# Patient Record
Sex: Female | Born: 1975 | Race: White | Hispanic: No | Marital: Married | Smoking: Never smoker
Health system: Southern US, Community
[De-identification: ages and names within clinical notes are randomized; demographics above are authoritative.]

## PROBLEM LIST (undated history)

## (undated) DIAGNOSIS — Z789 Other specified health status: Secondary | ICD-10-CM

## (undated) HISTORY — PX: BREAST FIBROADENOMA SURGERY: SHX580

## (undated) HISTORY — DX: Other specified health status: Z78.9

---

## 2003-04-03 ENCOUNTER — Other Ambulatory Visit: Admission: RE | Admit: 2003-04-03 | Discharge: 2003-04-03 | Payer: Self-pay | Admitting: Obstetrics & Gynecology

## 2012-01-25 ENCOUNTER — Other Ambulatory Visit: Payer: Self-pay | Admitting: Oncology

## 2013-12-28 HISTORY — PX: OTHER SURGICAL HISTORY: SHX169

## 2016-11-09 ENCOUNTER — Telehealth: Payer: Self-pay | Admitting: Genetic Counselor

## 2016-11-09 NOTE — Telephone Encounter (Signed)
Ms. Zepeda family member was seen for genetic counseling in [place] and she spoke with the genetic counselor there, Mariah Gleason, to discuss an appointment for her.  Mariah referred her to [place] as Ms. Caughell lives in [place] would be a closer visit for her.  We discussed how genetic counseling works and that cost of session is $240, but typically insurance will cover some of those costs.  Ms. Eyestone would like to make an appointment, so I gave her our scheduler, Nikki's number--[phone], so that she can do that.

## 2019-11-06 ENCOUNTER — Other Ambulatory Visit: Payer: Self-pay

## 2019-11-06 DIAGNOSIS — Z20822 Contact with and (suspected) exposure to covid-19: Secondary | ICD-10-CM

## 2019-11-09 LAB — NOVEL CORONAVIRUS, NAA: SARS-CoV-2, NAA: NOT DETECTED

## 2020-01-18 ENCOUNTER — Other Ambulatory Visit: Payer: Self-pay

## 2020-01-19 ENCOUNTER — Ambulatory Visit: Payer: BC Managed Care – PPO | Admitting: Family Medicine

## 2020-01-19 ENCOUNTER — Other Ambulatory Visit: Payer: Self-pay

## 2020-01-19 ENCOUNTER — Encounter: Payer: Self-pay | Admitting: Family Medicine

## 2020-01-19 VITALS — BP 108/68 | HR 98 | Temp 97.8°F | Ht 64.5 in | Wt 135.5 lb

## 2020-01-19 DIAGNOSIS — Z0000 Encounter for general adult medical examination without abnormal findings: Secondary | ICD-10-CM | POA: Diagnosis not present

## 2020-01-19 DIAGNOSIS — Z23 Encounter for immunization: Secondary | ICD-10-CM | POA: Diagnosis not present

## 2020-01-19 DIAGNOSIS — B001 Herpesviral vesicular dermatitis: Secondary | ICD-10-CM

## 2020-01-19 DIAGNOSIS — L989 Disorder of the skin and subcutaneous tissue, unspecified: Secondary | ICD-10-CM | POA: Diagnosis not present

## 2020-01-19 LAB — LIPID PANEL
Cholesterol: 138 mg/dL (ref 0–200)
HDL: 47.5 mg/dL (ref >39.00)
LDL Cholesterol: 70 mg/dL (ref 0–99)
NonHDL: 90.75
Total CHOL/HDL Ratio: 3
Triglycerides: 103 mg/dL (ref 0.0–149.0)
VLDL: 20.6 mg/dL (ref 0.0–40.0)

## 2020-01-19 LAB — COMPREHENSIVE METABOLIC PANEL
ALT: 14 U/L (ref 0–35)
AST: 15 U/L (ref 0–37)
Albumin: 4.3 g/dL (ref 3.5–5.2)
Alkaline Phosphatase: 47 U/L (ref 39–117)
BUN: 11 mg/dL (ref 6–23)
CO2: 27 mEq/L (ref 19–32)
Calcium: 9.4 mg/dL (ref 8.4–10.5)
Chloride: 103 mEq/L (ref 96–112)
Creatinine, Ser: 0.62 mg/dL (ref 0.40–1.20)
GFR: 105.87 mL/min (ref >60.00)
Glucose, Bld: 91 mg/dL (ref 70–99)
Potassium: 4 mEq/L (ref 3.5–5.1)
Sodium: 140 mEq/L (ref 135–145)
Total Bilirubin: 0.5 mg/dL (ref 0.2–1.2)
Total Protein: 7.1 g/dL (ref 6.0–8.3)

## 2020-01-19 LAB — CBC
HCT: 40.9 % (ref 36.0–46.0)
Hemoglobin: 14 g/dL (ref 12.0–15.0)
MCHC: 34.1 g/dL (ref 30.0–36.0)
MCV: 91.1 fl (ref 78.0–100.0)
Platelets: 247 10*3/uL (ref 150.0–400.0)
RBC: 4.49 Mil/uL (ref 3.87–5.11)
RDW: 13.3 % (ref 11.5–15.5)
WBC: 7 10*3/uL (ref 4.0–10.5)

## 2020-01-19 MED ORDER — VALACYCLOVIR HCL 1 G PO TABS: ORAL_TABLET | ORAL | 0 refills | Status: DC

## 2020-01-19 NOTE — Patient Instructions (Addendum)
Give [place] 2-3 business days to get the results of your labs back.   Keep the diet clean and stay active.  If you do not hear anything about your referral in the next 1-2 weeks, call our office and ask for an update.  Use triple antibiotic ointment over the lower part of your nose.  Let us know if you need anything.

## 2020-01-19 NOTE — Progress Notes (Signed)
Chief Complaint  Patient presents with  . New Patient (Initial Visit)    needs a physical     Well Woman Angela Petersen is here for a complete physical.   Her last physical was >1 year ago.  Current diet: in general, a "healthy" diet. Current exercise: HIIT, running. Weight is stable and she denies daytime fatigue. No LMP recorded. Seatbelt? Yes  Health Maintenance Pap/HPV- Yes Mammogram- Yes Tetanus- No. HIV screening- Yes  Past Medical History:  Diagnosis Date  . No known health problems      Past Surgical History:  Procedure Laterality Date  . [place], 2004, 2017, 2018   bilateral  . CESAREAN SECTION    . OTHER SURGICAL HISTORY  2015   Removed tubes   Medications  Takes no meds routinely.    Allergies No Known Allergies  Review of Systems:  Constitutional:  no unexpected weight changes Eye:  no recent significant change in vision Ear/Nose/Mouth/Throat:  Ears:  no recent change in hearing Nose/Mouth/Throat:  no complaints of nasal congestion, no sore throat Cardiovascular: no chest pain Respiratory:  no shortness of breath Gastrointestinal:  no abdominal pain, no change in bowel habits GU:  Female: negative for dysuria or pelvic pain Musculoskeletal/Extremities:  no pain of the joints Integumentary (Skin/Breast): +recurrent scaling/bleeding area on nose; otherwise no abnormal skin lesions reported Neurologic:  no headaches Endocrine:  denies fatigue Hematologic/Lymphatic:  No areas of easy bleeding  Exam BP 108/68 (BP Location: Left Arm, Patient Position: Sitting, Cuff Size: Normal)   Pulse 98   Temp 97.8 F (36.6 C) (Temporal)   Ht 5' 4.5" (1.638 m)   Wt 135 lb 8 oz (61.5 kg)   SpO2 98%   BMI 22.90 kg/m  General:  well developed, well nourished, in no apparent distress Skin: lateral R nostril excoriation; there is a slightly raised and scaly lesion on L bridge of nose with some telangiectasis; otherwise no significant  moles, warts, or growths Head:  no masses, lesions, or tenderness Eyes:  pupils equal and round, sclera anicteric without injection Ears:  canals without lesions, TMs shiny without retraction, no obvious effusion, no erythema Nose:  nares patent, septum midline, mucosa normal, and no drainage or sinus tenderness Throat/Pharynx:  lips and gingiva without lesion; tongue and uvula midline; non-inflamed pharynx; no exudates or postnasal drainage Neck: neck supple without adenopathy, thyromegaly, or masses Lungs:  clear to auscultation, breath sounds equal bilaterally, no respiratory distress Cardio:  regular rate and rhythm, no LE edema Abdomen:  abdomen soft, nontender; bowel sounds normal; no masses or organomegaly Genital: Defer to GYN Musculoskeletal:  symmetrical muscle groups noted without atrophy or deformity Extremities:  no clubbing, cyanosis, or edema, no deformities, no skin discoloration Neuro:  gait normal; deep tendon reflexes normal and symmetric Psych: well oriented with normal range of affect and appropriate judgment/insight  Assessment and Plan  Well adult exam - Plan: Lipid panel, CBC, Comprehensive metabolic panel  Skin lesion - Plan: Ambulatory referral to Dermatology  Cold sore  Need for Tdap vaccination - Plan: Tdap vaccine greater than or equal to 7yo IM  Need for influenza vaccination - Plan: Flu Vaccine QUAD 6+ mos PF IM (Fluarix Quad PF)   [name]. Counseled on diet and exercise. Shared decision making performed regarding mammograms for breast cancer screening and limitations in younger females. TAO and valtrex for area on R nostril that recurs q winter.  Refer to derm for raised and scaly  lesion on nose, concern for SCC vs BCC.  Other orders as above. Follow up in 1 yr or prn. The patient voiced understanding and agreement to the plan.  [place], DO 01/19/20 2:46 PM

## 2020-10-22 ENCOUNTER — Other Ambulatory Visit: Payer: Self-pay | Admitting: Family Medicine

## 2020-10-22 DIAGNOSIS — C73 Malignant neoplasm of thyroid gland: Secondary | ICD-10-CM

## 2020-10-23 ENCOUNTER — Other Ambulatory Visit: Payer: Self-pay | Admitting: Family Medicine

## 2020-10-23 DIAGNOSIS — E042 Nontoxic multinodular goiter: Secondary | ICD-10-CM

## 2020-10-24 ENCOUNTER — Other Ambulatory Visit: Payer: Self-pay

## 2020-10-24 ENCOUNTER — Other Ambulatory Visit (INDEPENDENT_AMBULATORY_CARE_PROVIDER_SITE_OTHER): Payer: BC Managed Care – PPO

## 2020-10-24 DIAGNOSIS — C73 Malignant neoplasm of thyroid gland: Secondary | ICD-10-CM | POA: Diagnosis not present

## 2020-10-25 LAB — TSH: TSH: 1.37 mIU/L

## 2020-10-25 LAB — T4, FREE: Free T4: 1 ng/dL (ref 0.8–1.8)

## 2020-10-30 ENCOUNTER — Other Ambulatory Visit: Payer: Self-pay

## 2020-10-30 ENCOUNTER — Ambulatory Visit (HOSPITAL_BASED_OUTPATIENT_CLINIC_OR_DEPARTMENT_OTHER)
Admission: RE | Admit: 2020-10-30 | Discharge: 2020-10-30 | Disposition: A | Discharge status: Home / Self Care | Payer: BC Managed Care – PPO | Source: Ambulatory Visit | Attending: Family Medicine | Admitting: Family Medicine

## 2020-10-30 DIAGNOSIS — E042 Nontoxic multinodular goiter: Secondary | ICD-10-CM | POA: Diagnosis present

## 2021-01-29 ENCOUNTER — Ambulatory Visit (INDEPENDENT_AMBULATORY_CARE_PROVIDER_SITE_OTHER): Payer: BC Managed Care – PPO | Admitting: Family Medicine

## 2021-01-29 ENCOUNTER — Other Ambulatory Visit: Payer: Self-pay

## 2021-01-29 ENCOUNTER — Encounter: Payer: Self-pay | Admitting: Family Medicine

## 2021-01-29 VITALS — BP 102/62 | HR 69 | Temp 98.0°F | Ht 64.5 in | Wt 138.0 lb

## 2021-01-29 DIAGNOSIS — Z23 Encounter for immunization: Secondary | ICD-10-CM | POA: Diagnosis not present

## 2021-01-29 DIAGNOSIS — Z800 Family history of malignant neoplasm of digestive organs: Secondary | ICD-10-CM | POA: Diagnosis not present

## 2021-01-29 DIAGNOSIS — L570 Actinic keratosis: Secondary | ICD-10-CM | POA: Diagnosis not present

## 2021-01-29 DIAGNOSIS — Z1159 Encounter for screening for other viral diseases: Secondary | ICD-10-CM | POA: Diagnosis not present

## 2021-01-29 DIAGNOSIS — Z0001 Encounter for general adult medical examination with abnormal findings: Secondary | ICD-10-CM | POA: Diagnosis not present

## 2021-01-29 DIAGNOSIS — Z0000 Encounter for general adult medical examination without abnormal findings: Secondary | ICD-10-CM

## 2021-01-29 LAB — COMPREHENSIVE METABOLIC PANEL
ALT: 16 U/L (ref 0–35)
AST: 15 U/L (ref 0–37)
Albumin: 4.4 g/dL (ref 3.5–5.2)
Alkaline Phosphatase: 55 U/L (ref 39–117)
BUN: 12 mg/dL (ref 6–23)
CO2: 32 mEq/L (ref 19–32)
Calcium: 9.7 mg/dL (ref 8.4–10.5)
Chloride: 102 mEq/L (ref 96–112)
Creatinine, Ser: 0.64 mg/dL (ref 0.40–1.20)
GFR: 109 mL/min (ref >60.00)
Glucose, Bld: 104 mg/dL — ABNORMAL HIGH (ref 70–99)
Potassium: 3.7 mEq/L (ref 3.5–5.1)
Sodium: 139 mEq/L (ref 135–145)
Total Bilirubin: 0.5 mg/dL (ref 0.2–1.2)
Total Protein: 7.2 g/dL (ref 6.0–8.3)

## 2021-01-29 LAB — LIPID PANEL
Cholesterol: 161 mg/dL (ref 0–200)
HDL: 53 mg/dL (ref >39.00)
LDL Cholesterol: 90 mg/dL (ref 0–99)
NonHDL: 107.79
Total CHOL/HDL Ratio: 3
Triglycerides: 91 mg/dL (ref 0.0–149.0)
VLDL: 18.2 mg/dL (ref 0.0–40.0)

## 2021-01-29 NOTE — Progress Notes (Signed)
Chief Complaint  Patient presents with  . Annual Exam     Well Woman Angela Petersen is here for a complete physical.   Her last physical was >1 year ago.  Current diet: in general, a "healthy" diet. Current exercise: walking, wt lifting. Weight is stable and she denies fatigue out of ordinary. Seatbelt? Yes  Health Maintenance Pap/HPV- Yes Mammogram- Yes Tetanus- Yes Hep C screening- No HIV screening- Yes  Past Medical History:  Diagnosis Date  . No known health problems      Past Surgical History:  Procedure Laterality Date  . [place], 2004, 2017, 2018   bilateral  . CESAREAN SECTION    . OTHER SURGICAL HISTORY  2015   Removed tubes    Medications  Current Outpatient Medications on File Prior to Visit  Medication Sig Dispense Refill  . valACYclovir (VALTREX) 1000 MG tablet Take 2 tabs by mouth and repeat dose 12 hours later during outbreaks. 20 tablet 0   Allergies No Known Allergies  Review of Systems: Constitutional:  no unexpected weight changes Eye:  no recent significant change in vision Ear/Nose/Mouth/Throat:  Ears:  no recent change in hearing Nose/Mouth/Throat:  no complaints of nasal congestion, no sore throat Cardiovascular: no chest pain Respiratory:  no shortness of breath Gastrointestinal:  no abdominal pain, no change in bowel habits GU:  Female: negative for dysuria or pelvic pain Musculoskeletal/Extremities:  no pain of the joints Integumentary (Skin/Breast): red/scaly lesion on L shoulder Neurologic:  no headaches Endocrine:  denies fatigue Hematologic/Lymphatic:  No areas of easy bleeding  Exam BP 102/62 (BP Location: Right Arm, Patient Position: Sitting, Cuff Size: Normal)   Pulse 69   Temp 98 F (36.7 C) (Oral)   Ht 5' 4.5" (1.638 m)   Wt 138 lb (62.6 kg)   SpO2 99%   BMI 23.32 kg/m  General:  well developed, well nourished, in no apparent distress Skin: L anterior shoulder, there is a 0.7 x 0.3 cm  lesion that is red at the base and scaly- no fluctuance, drainage, ttp, induration. Otherwise no significant moles, warts, or growths Head:  no masses, lesions, or tenderness Eyes:  pupils equal and round, sclera anicteric without injection Ears:  canals without lesions, TMs shiny without retraction, no obvious effusion, no erythema Nose:  nares patent, septum midline, mucosa normal, and no drainage or sinus tenderness Throat/Pharynx:  lips and gingiva without lesion; tongue and uvula midline; non-inflamed pharynx; no exudates or postnasal drainage Neck: neck supple without adenopathy, thyromegaly, or masses Lungs:  clear to auscultation, breath sounds equal bilaterally, no respiratory distress Cardio:  regular rate and rhythm, no LE edema Abdomen:  abdomen soft, nontender; bowel sounds normal; no masses or organomegaly Genital: Defer to GYN Musculoskeletal:  symmetrical muscle groups noted without atrophy or deformity Extremities:  no clubbing, cyanosis, or edema, no deformities, no skin discoloration Neuro:  gait normal; deep tendon reflexes normal and symmetric Psych: well oriented with normal range of affect and appropriate judgment/insight  Procedure note: cryotherapy Verbal consent obtained 1 skin lesion treated Liquid nitrogen was applied via a thin spray creating an ice ball with 1-2 mm corona surrounding the lesion The patient tolerated the procedure well There were no immediate complications noted  Assessment and Plan  Well adult exam - Plan: Comprehensive metabolic panel, Lipid panel  Encounter for hepatitis C screening test for low risk patient - Plan: Hepatitis C antibody  Family history of colon cancer - Plan: Ambulatory referral to  Gastroenterology  Need for influenza vaccination - Plan: Flu Vaccine QUAD 6+ mos PF IM (Fluarix Quad PF)  Actinic keratosis - Plan: PR DESTRUC PREMAL,FIRST LESION   [name]. Counseled on diet and exercise. Famhx of CC. Had  colonoscopy 08/20/2016 in [place]. Will be in [place] for 2 yrs on mission trip this Aug, hoping to get before she leaves. Not ideal for her/family to get in [place]. Will refer to GI for discussion. AK on shoulder, froze today.  Verrucae and SK on RLE and back respectively.  Other orders as above. Follow up in 1 yr for CPE or prn, though I will probably not see her again given travel plans. The patient voiced understanding and agreement to the plan.  [place], DO 01/29/21 1:28 PM

## 2021-01-29 NOTE — Patient Instructions (Signed)
Give us 2-3 business days to get the results of your labs back.   Keep the diet clean and stay active.  If you do not hear anything about your referral in the next 1-2 weeks, call our office and ask for an update.  Let us know if you need anything. 

## 2021-01-30 LAB — HEPATITIS C ANTIBODY
Hepatitis C Ab: NONREACTIVE
SIGNAL TO CUT-OFF: 0.03 (ref <1.00)

## 2021-03-04 ENCOUNTER — Encounter: Payer: Self-pay | Admitting: Family Medicine

## 2021-03-04 NOTE — Progress Notes (Signed)
Patient was sent mychart msg-notified of results.

## 2022-01-11 IMAGING — US US THYROID
1 series · 12 of 23 positions shown · non-contrast
Comparison: Prior study at [REDACTED] on 10/20/2016 and report
from a prior study on 01/12/2019 at [facility] (images
unavailable)

CLINICAL DATA: Prior ultrasound follow-up.

EXAM:
THYROID ULTRASOUND
TECHNIQUE: Ultrasound examination of the thyroid gland and adjacent soft
tissues was performed.

[Series 1: us thyroid · 12 of 23 slices shown]
[im 1/23]
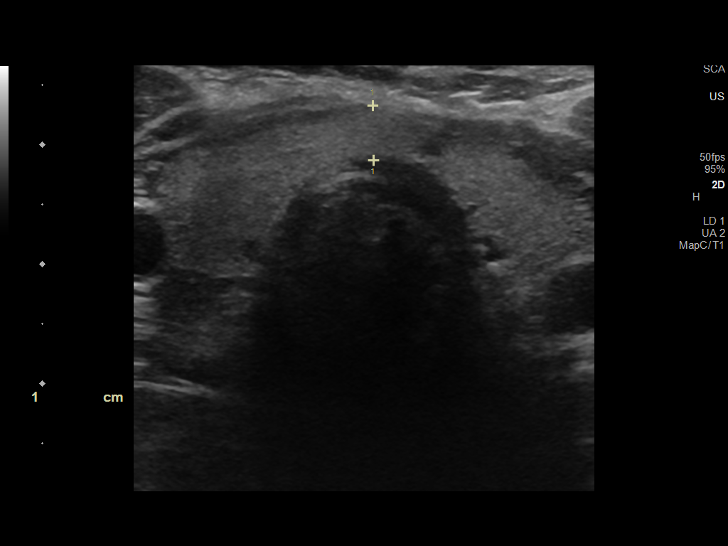
[im 3/23]
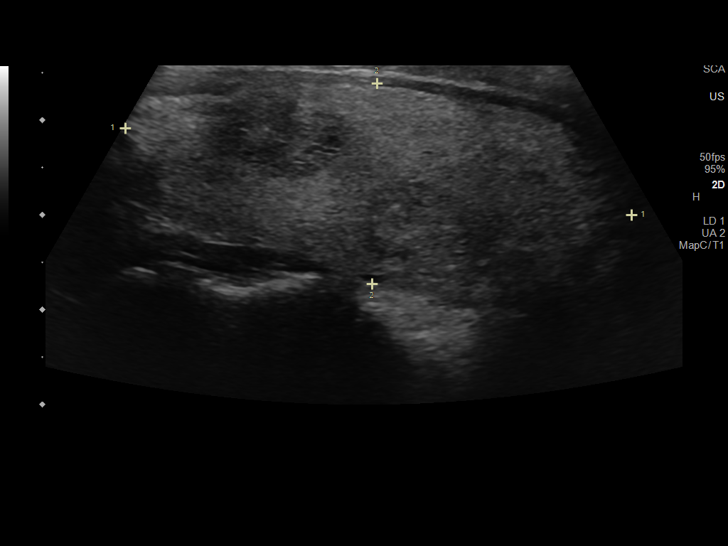
[im 5/23]
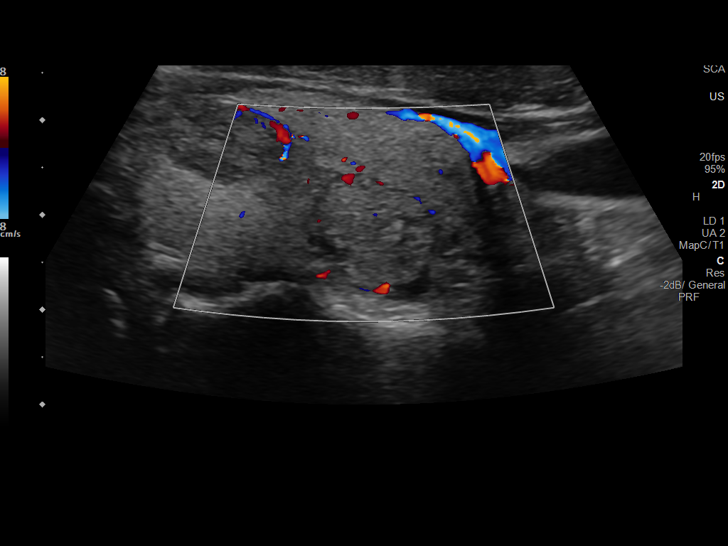
[im 7/23]
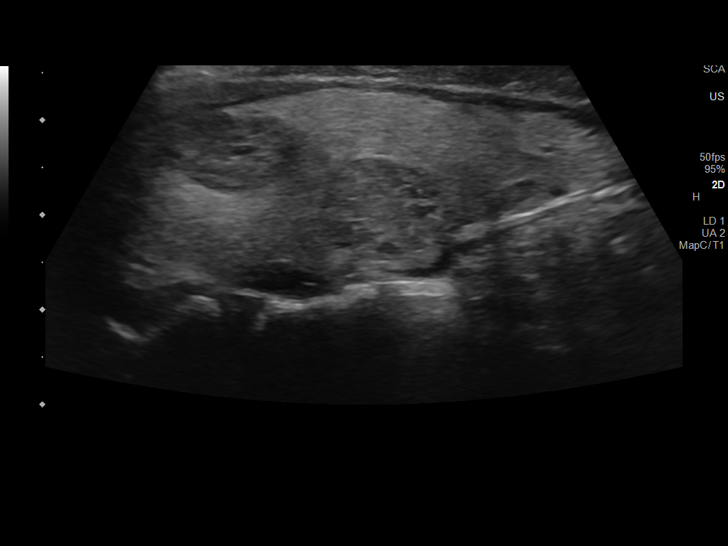
[im 9/23]
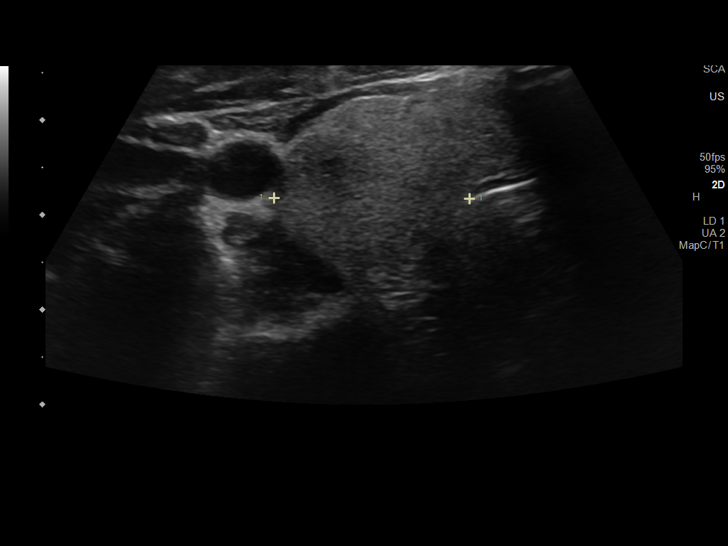
[im 11/23]
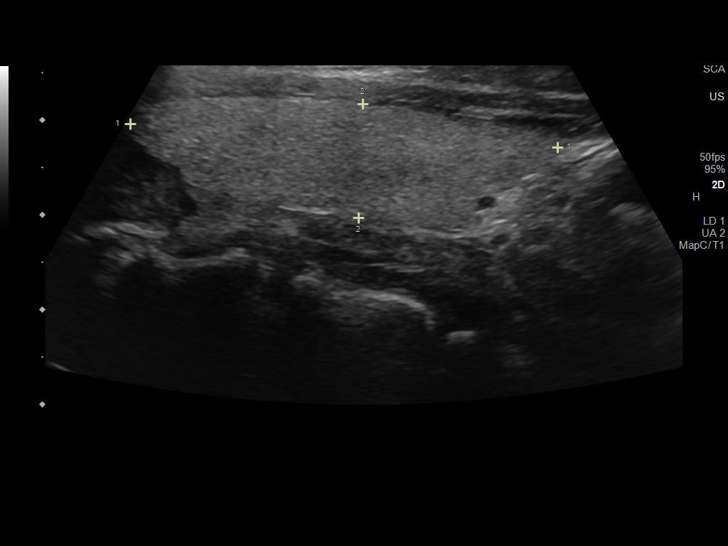
[im 13/23]
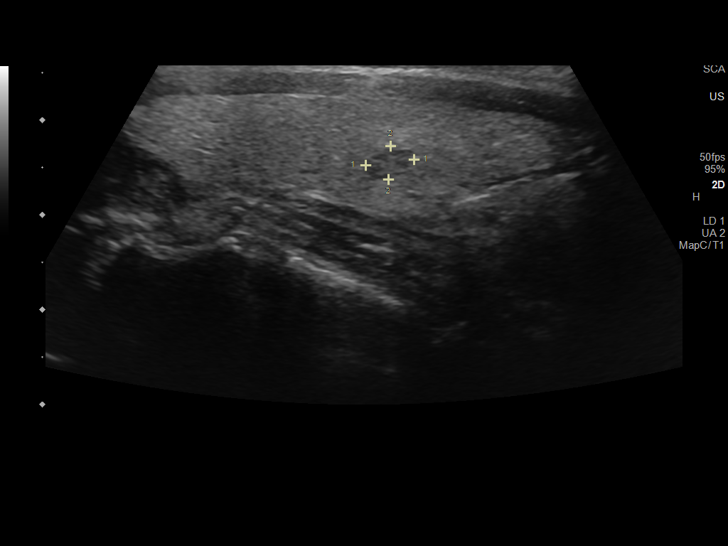
[im 15/23]
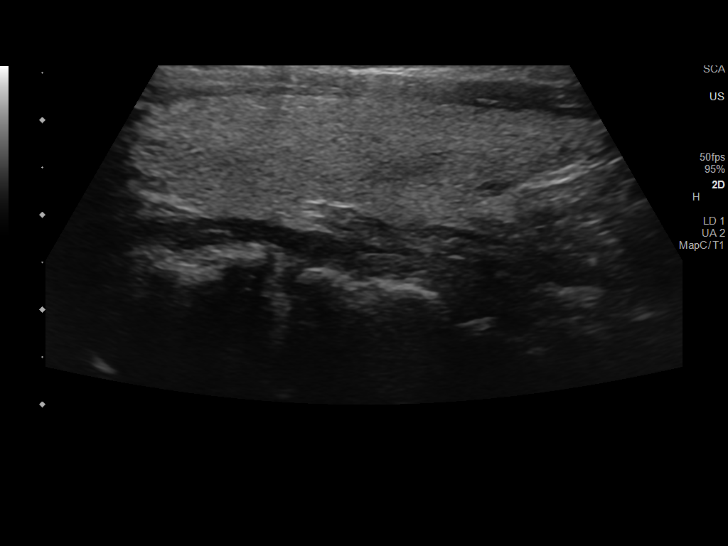
[im 17/23]
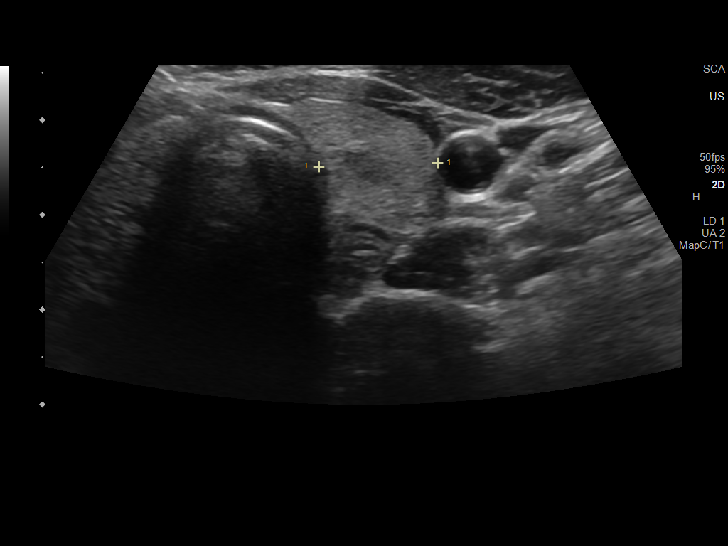
[im 19/23]
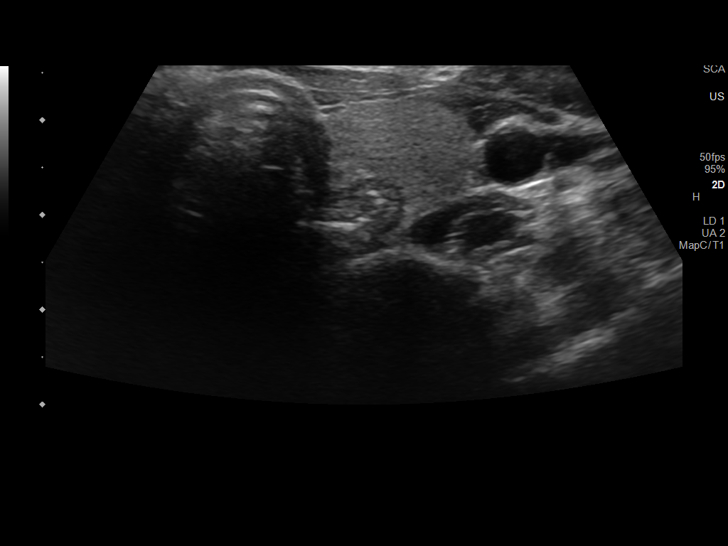
[im 21/23]
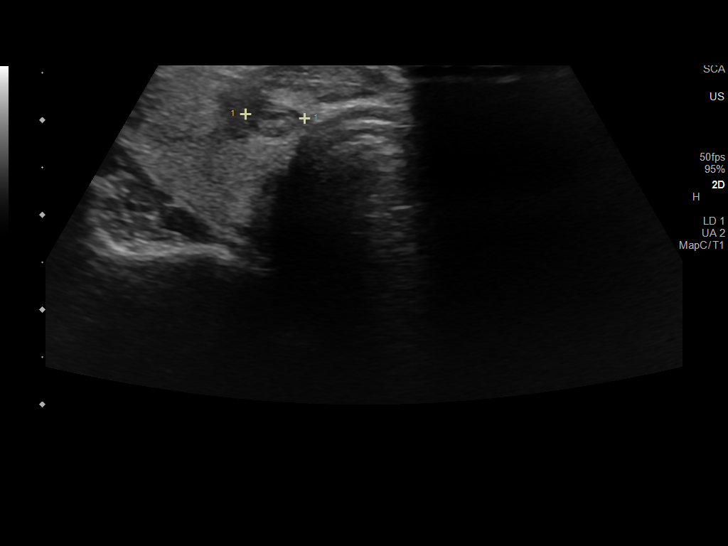
[im 23/23]
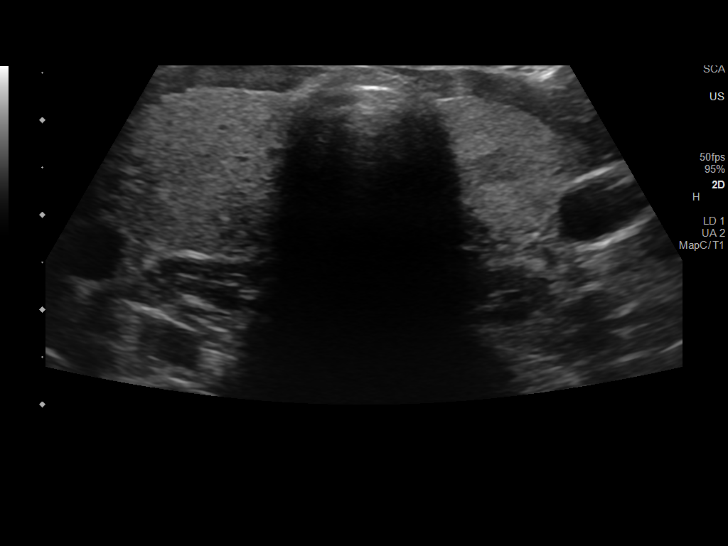

[12 of 23 positions shown; findings below may reference images not displayed]

FINDINGS: Parenchymal Echotexture: Mildly heterogenous

Isthmus: 0.5 cm

Right lobe: 5.4 x 2.1 x 2.1 cm

Left lobe: 4.5 x 1.2 x 1.3 cm

_________________________________________________________

Estimated total number of nodules >/= 1 cm: 2

Number of spongiform nodules >/=  2 cm not described below (TR1): 0

Number of mixed cystic and solid nodules >/= 1.5 cm not described
below (TR2): 0

_________________________________________________________

Nodule # 1:

Prior biopsy: No

Location: Right; Superior

Maximum size: 1.5 cm; Other 2 dimensions: 1.3 x 0.8 cm, previously,
2.5 cm solid and cystic (mostly cystic) nodule in 6182.

Composition: solid/almost completely solid (2)

Echogenicity: hypoechoic (2)

Shape: not taller-than-wide (0)

Margins: smooth (0)

Echogenic foci: none (0)

ACR TI-RADS total points: 4.

ACR TI-RADS risk category:  TR4 (4-6 points).

Significant change in size (>/= 20% in two dimensions and minimal
increase of 2 mm): This likely corresponds to what was reported as a
1.4 cm right mid nodule on the outside 1919 report. This is more
accurately placed in the superior aspect of the right lobe.

Change in features: No

Change in ACR TI-RADS risk category: No

ACR TI-RADS recommendations:

This is right on the border of either 1 year follow-up ultrasound or
recommendation of fine-needle aspiration. However, given that this
used to represent a much larger solid and cystic lesion which has
contracted since 6182, a 1 year follow-up ultrasound would be
appropriate as there is very low suspicion for malignancy.

_________________________________________________________

Nodule # 2:

Prior biopsy: No

Location: Right; Inferior (previously described as mid but more
appropriately described as inferior)

Maximum size: 1.5 cm; Other 2 dimensions: 1.1 x 1.0 cm, previously,
1.3 cm in 6182 and 1.4 cm by outside 1919 report.

Composition: solid/almost completely solid (2)

Echogenicity: isoechoic (1)

Shape: not taller-than-wide (0)

Margins: smooth (0)

Echogenic foci: none (0)

ACR TI-RADS total points: 3.

ACR TI-RADS risk category:  TR3 (3 points).

Significant change in size (>/= 20% in two dimensions and minimal
increase of 2 mm): No

Change in features: No

Change in ACR TI-RADS risk category: Yes. The lesion is probably
isoechoic and not hypoechoic which down grades it to TR3.

ACR TI-RADS recommendations:

*Given size (>/= 1.5 - 2.4 cm) and appearance, a follow-up
ultrasound in 1 year should be considered based on TI-RADS criteria.

_________________________________________________________

Tiny 0.5 cm nodule in the inferior left lobe is stable and does not
meet criteria for biopsy or follow-up. Tiny 0.6 cm nodule in the
medial right lobe near the isthmus does not meet criteria for biopsy
or follow-up. No abnormal lymph nodes identified.
IMPRESSION: 1.5 cm right superior and 1.5 cm right inferior nodules with
description and discussion as above. As discussed, 1 year follow-up
ultrasound is recommended for both of these nodules.

The above is in keeping with the ACR TI-RADS recommendations - [HOSPITAL] 6182;[DATE].

## 2023-11-02 ENCOUNTER — Encounter: Payer: Self-pay | Admitting: Family Medicine

## 2023-11-02 ENCOUNTER — Ambulatory Visit: Payer: BC Managed Care – PPO | Admitting: Family Medicine

## 2023-11-02 VITALS — BP 110/64 | HR 67 | Temp 98.0°F | Resp 16 | Ht 64.0 in | Wt 144.4 lb

## 2023-11-02 DIAGNOSIS — J0100 Acute maxillary sinusitis, unspecified: Secondary | ICD-10-CM | POA: Diagnosis not present

## 2023-11-02 DIAGNOSIS — R051 Acute cough: Secondary | ICD-10-CM

## 2023-11-02 MED ORDER — DOXYCYCLINE HYCLATE 100 MG PO TABS: 100.0000 mg | ORAL_TABLET | Freq: Two times a day (BID) | ORAL | 0 refills | Status: AC

## 2023-11-02 MED ORDER — VALACYCLOVIR HCL 1 G PO TABS
ORAL_TABLET | ORAL | 0 refills | Status: DC
Start: 2023-11-02 — End: 2023-11-03

## 2023-11-02 MED ORDER — PREDNISONE 20 MG PO TABS: 40.0000 mg | ORAL_TABLET | Freq: Every day | ORAL | 0 refills | Status: AC

## 2023-11-02 MED ORDER — FLUCONAZOLE 150 MG PO TABS: ORAL_TABLET | ORAL | 0 refills | Status: AC

## 2023-11-02 NOTE — Patient Instructions (Addendum)
Continue to push fluids, practice good hand hygiene, and cover your mouth if you cough.  If you start having fevers, shaking or shortness of breath, seek immediate care.  OK to take Tylenol 1000 mg (2 extra strength tabs) or 975 mg (3 regular strength tabs) every 6 hours as needed.  Start the prednisone for 2 days and if no improvement, take the doxycycline.   Let us know if you need anything.

## 2023-11-02 NOTE — Progress Notes (Signed)
Chief Complaint  Patient presents with   Nasal Congestion    Nasal congestion     Gladstone Lighter here for URI complaints.  Duration: 8 days  Associated symptoms: sinus congestion, sinus pain, myalgia, loss of voice, and slight coughing Denies: rhinorrhea, itchy watery eyes, ear pain, ear drainage, sore throat, wheezing, shortness of breath, fevers, dental pain, N/V/D Treatment to date: Dayquil, Nyquil, Coricidin  Sick contacts: No  Past Medical History:  Diagnosis Date   No known health problems     Objective BP 110/64 (BP Location: Left Arm, Patient Position: Sitting, Cuff Size: Normal)   Pulse 67   Temp 98 F (36.7 C) (Oral)   Resp 16   Ht 5\' 4"  (1.626 m)   Wt 144 lb 6.4 oz (65.5 kg)   SpO2 98%   BMI 24.79 kg/m  General: Awake, alert, appears stated age HEENT: AT, [place], ears patent b/l and TM's neg, nares patent w/o discharge, pharynx pink and without exudates, MMM, mild ttp over max sinuses b/l Neck: No masses or asymmetry Heart: RRR Lungs: CTAB, no accessory muscle use Psych: Age appropriate judgment and insight, normal mood and affect  Acute maxillary sinusitis, recurrence not specified - Plan: doxycycline (VIBRA-TABS) 100 MG tablet, predniSONE (DELTASONE) 20 MG tablet  Acute cough  5 d pred burst 40 mg/d. If no improvement in 2 d, will start doxy for 7 d. Diflucan prn. Continue to push fluids, practice good hand hygiene, cover mouth when coughing. F/u prn. If starting to experience fevers, shaking, or shortness of breath, seek immediate care. Pt voiced understanding and agreement to the plan.  Angela Roche Vazquez, DO 11/02/23 4:04 PM

## 2023-11-03 ENCOUNTER — Other Ambulatory Visit: Payer: Self-pay

## 2023-11-03 MED ORDER — VALACYCLOVIR HCL 1 G PO TABS: ORAL_TABLET | ORAL | 0 refills | Status: AC

## 2024-02-15 ENCOUNTER — Ambulatory Visit: Payer: BC Managed Care – PPO | Admitting: Family

## 2024-02-15 ENCOUNTER — Encounter: Payer: Self-pay | Admitting: Family

## 2024-02-15 VITALS — BP 112/64 | HR 87 | Ht 64.0 in | Wt 149.0 lb

## 2024-02-15 DIAGNOSIS — R300 Dysuria: Secondary | ICD-10-CM | POA: Diagnosis not present

## 2024-02-15 LAB — POCT URINALYSIS DIPSTICK
Bilirubin, UA: NEGATIVE
Blood, UA: NEGATIVE
Glucose, UA: NEGATIVE
Ketones, UA: NEGATIVE
Nitrite, UA: NEGATIVE
Protein, UA: NEGATIVE
Spec Grav, UA: <=1.005 — AB (ref 1.010–1.025)
Urobilinogen, UA: 0.2 U/dL
pH, UA: 6.5 (ref 5.0–8.0)

## 2024-02-15 MED ORDER — SULFAMETHOXAZOLE-TRIMETHOPRIM 800-160 MG PO TABS: 1.0000 | ORAL_TABLET | Freq: Two times a day (BID) | ORAL | 0 refills | Status: DC

## 2024-02-15 NOTE — Progress Notes (Signed)
  Angela Petersen is a 48 y.o. female with the following history as recorded in EpicCare:  There are no active problems to display for this patient.   Current Outpatient Medications  Medication Sig Dispense Refill   anastrozole (ARIMIDEX) 1 MG tablet Take 1 tablet by mouth daily.     sulfamethoxazole-trimethoprim (BACTRIM DS) 800-160 MG tablet Take 1 tablet by mouth 2 (two) times daily. 10 tablet 0   valACYclovir (VALTREX) 1000 MG tablet Take 2 tabs by mouth and repeat dose 12 hours later during outbreaks. 30 tablet 0   fluconazole (DIFLUCAN) 150 MG tablet Take 1 tab, repeat in 72 hours if no improvement. 2 tablet 0   No current facility-administered medications for this visit.    Allergies: Patient has no known allergies.  Past Medical History:  Diagnosis Date   No known health problems     Past Surgical History:  Procedure Laterality Date   BREAST FIBROADENOMA SURGERY  1994, 2004, 2017, 2018   bilateral   CESAREAN SECTION     OTHER SURGICAL HISTORY  2015   Removed tubes    Family History  Problem Relation Age of Onset   Cancer Mother 23       breast cancer   Cancer Sister 21       thyroid cancer   Cancer Brother 25       colon cancer   Cancer Maternal Aunt 57       breast cancer   Cancer Maternal Grandmother        Breast cancer   Cancer Paternal Grandmother        Breast cancer   Cancer Maternal Aunt 43       breast cancer    Social History   Tobacco Use   Smoking status: Never   Smokeless tobacco: Never  Substance Use Topics   Alcohol use: Never    Subjective:   Concerned for UTI; symptoms x 1 week; prone to recurrent infections; forgot to take her Macrobid after recent intercourse as prescribed;  per GYN directions, has taken Macrobid twice x 5 days for the past few days- still having urgency; concerned that Macrobid is not the correct antibiotic; no blood in urine, no fever;   Objective:  Vitals:   02/15/24 1124  BP: 112/64  Pulse: 87  SpO2: 97%   Weight: 149 lb (67.6 kg)  Height: 5\' 4"  (1.626 m)    General: Well developed, well nourished, in no acute distress  Skin : Warm and dry.  Head: Normocephalic and atraumatic  Lungs: Respirations unlabored; clear to auscultation bilaterally without wheeze, rales, rhonchi  CVS exam: normal rate and regular rhythm.  Neurologic: Alert and oriented; speech intact; face symmetrical; moves all extremities well; CNII-XII intact without focal deficit   Assessment:  1. Dysuria     Plan:  Suspect Macrobid not able to treat the infection completely; check urine culture today; change to Bactrim DS bid x 5 days; follow up to be determined;   No follow-ups on file.  Orders Placed This Encounter  Procedures   Urine Culture   POCT Urinalysis Dipstick    Requested Prescriptions   Signed Prescriptions Disp Refills   sulfamethoxazole-trimethoprim (BACTRIM DS) 800-160 MG tablet 10 tablet 0    Sig: Take 1 tablet by mouth 2 (two) times daily.

## 2024-02-16 LAB — URINE CULTURE
MICRO NUMBER:: 16098005
Result:: NO GROWTH
SPECIMEN QUALITY:: ADEQUATE

## 2024-02-17 ENCOUNTER — Encounter: Payer: Self-pay | Admitting: Family

## 2024-06-29 DIAGNOSIS — Z170 Estrogen receptor positive status [ER+]: Secondary | ICD-10-CM | POA: Diagnosis not present

## 2024-06-29 DIAGNOSIS — C50412 Malignant neoplasm of upper-outer quadrant of left female breast: Secondary | ICD-10-CM | POA: Diagnosis not present

## 2024-06-29 DIAGNOSIS — N6311 Unspecified lump in the right breast, upper outer quadrant: Secondary | ICD-10-CM | POA: Diagnosis not present

## 2024-06-29 DIAGNOSIS — Z79811 Long term (current) use of aromatase inhibitors: Secondary | ICD-10-CM | POA: Diagnosis not present

## 2024-06-29 DIAGNOSIS — M8589 Other specified disorders of bone density and structure, multiple sites: Secondary | ICD-10-CM | POA: Diagnosis not present

## 2024-09-22 ENCOUNTER — Telehealth: Payer: Self-pay | Admitting: Family Medicine

## 2024-09-22 NOTE — Telephone Encounter (Signed)
 Copied from CRM [id number]. Topic: Referral - Request for Referral >> Sep 22, 2024  4:17 PM [place] J wrote: Did the patient discuss referral with their provider in the last year? Yes (If No - schedule appointment) (If Yes - send message)  Appointment offered? No  Type of order/referral and detailed reason for visit: Patient would like to be referred to endocrinology. She has been out of the country and had her thyroids managed by an endocrinologist who was monitoring modules on her thyroids. Her sister has a thyroid cancer so she wants to be sure this is closely looked at.  Preference of office, provider, location:  Lela C. Trixie, MD  [facility] [address] [place], [place] [address]  If referral order, have you been seen by this specialty before? Yes (If Yes, this issue or another issue? When? Where? Patient has been seen by an endocrinologist outside of the country for the same issue.  Can we respond through MyChart? No Please call patient at [phone]

## 2024-09-25 NOTE — Telephone Encounter (Signed)
 Sent pt message letting her know she needs appointment.

## 2024-10-11 ENCOUNTER — Encounter: Payer: Self-pay | Admitting: Family Medicine

## 2024-10-11 ENCOUNTER — Ambulatory Visit: Admitting: Family Medicine

## 2024-10-11 VITALS — BP 116/70 | HR 100 | Temp 98.0°F | Resp 16 | Ht 64.0 in | Wt 144.0 lb

## 2024-10-11 DIAGNOSIS — E041 Nontoxic single thyroid nodule: Secondary | ICD-10-CM

## 2024-10-11 DIAGNOSIS — Z808 Family history of malignant neoplasm of other organs or systems: Secondary | ICD-10-CM | POA: Diagnosis not present

## 2024-10-11 DIAGNOSIS — Z0000 Encounter for general adult medical examination without abnormal findings: Secondary | ICD-10-CM

## 2024-10-11 NOTE — Progress Notes (Signed)
 Chief Complaint  Patient presents with   Annual Exam    CPE     Well Woman Angela Petersen is here for a complete physical.   Her last physical was >1 year ago.  Current diet: in general, a healthy diet. Current exercise: lifting wts, walking. Weight is stable and she denies fatigue out of ordinary. No LMP recorded. Patient has had a hysterectomy. Seatbelt? Yes Advanced directive? No  Health Maintenance Pap/HPV- N/A Mammogram- Yes Tetanus- Yes Hep C screening- Yes HIV screening- Yes  Thyroid nodules Patient has a history of thyroid nodules and a family history of thyroid cancer.  She was also have yearly thyroid ultrasounds.  Now that she is going to stay in the area for about foreseeable future, she would like to see an endocrinologist in the area.  No pain in the neck or enlarging masses that she is aware of.  Past Medical History:  Diagnosis Date   No known health problems      Past Surgical History:  Procedure Laterality Date   BREAST FIBROADENOMA SURGERY  1994, 2004, 2017, 2018   bilateral   CESAREAN SECTION     OTHER SURGICAL HISTORY  2015   Removed tubes    Medications  Current Outpatient Medications on File Prior to Visit  Medication Sig Dispense Refill   anastrozole (ARIMIDEX) 1 MG tablet Take 1 tablet by mouth daily.     fluconazole  (DIFLUCAN ) 150 MG tablet Take 1 tab, repeat in 72 hours if no improvement. 2 tablet 0   sulfamethoxazole -trimethoprim  (BACTRIM  DS) 800-160 MG tablet Take 1 tablet by mouth 2 (two) times daily. 10 tablet 0   valACYclovir  (VALTREX ) 1000 MG tablet Take 2 tabs by mouth and repeat dose 12 hours later during outbreaks. 30 tablet 0    Allergies No Known Allergies  Review of Systems: Constitutional:  no unexpected weight changes Eye:  no recent significant change in vision Ear/Nose/Mouth/Throat:  Ears:  no recent change in hearing Nose/Mouth/Throat:  no complaints of nasal congestion, no sore throat Cardiovascular: no chest  pain Respiratory:  no shortness of breath Gastrointestinal:  no abdominal pain, no change in bowel habits GU:  Female: negative for dysuria or pelvic pain Musculoskeletal/Extremities:  no pain of the joints Integumentary (Skin/Breast):  no abnormal skin lesions reported Neurologic:  no headaches Endocrine:  denies fatigue Hematologic/Lymphatic:  No areas of easy bleeding  Exam BP 116/70 (BP Location: Left Arm, Patient Position: Sitting)   Pulse 100   Temp 98 F (36.7 C) (Oral)   Resp 16   Ht 5' 4 (1.626 m)   Wt 144 lb (65.3 kg)   SpO2 98%   BMI 24.72 kg/m  General:  well developed, well nourished, in no apparent distress Skin:  no significant moles, warts, or growths Head:  no masses, lesions, or tenderness Eyes:  pupils equal and round, sclera anicteric without injection Ears:  canals without lesions, TMs shiny without retraction, no obvious effusion, no erythema Nose:  nares patent, mucosa normal, and no drainage Throat/Pharynx:  lips and gingiva without lesion; tongue and uvula midline; non-inflamed pharynx; no exudates or postnasal drainage Neck: neck supple without adenopathy, thyromegaly, or masses Lungs:  clear to auscultation, breath sounds equal bilaterally, no respiratory distress Cardio:  regular rate and rhythm, no LE edema Abdomen:  abdomen soft, nontender; bowel sounds normal; no masses or organomegaly Genital: Defer to GYN Musculoskeletal:  symmetrical muscle groups noted without atrophy or deformity Extremities:  no clubbing, cyanosis, or edema, no deformities,  no skin discoloration Neuro:  gait normal; deep tendon reflexes normal and symmetric Psych: well oriented with normal range of affect and appropriate judgment/insight  Assessment and Plan  Well adult exam - Plan: CBC, Comprehensive metabolic panel with GFR, Lipid panel  Family history of thyroid cancer - Plan: Ambulatory referral to Endocrinology  Thyroid nodule - Plan: TSH, T4, free, US  THYROID    [name]. Counseled on diet and exercise. Advanced directive form provided today.  Flu shot politely declined. Refer to endocrinology for the family history of thyroid cancer.  For her nodules, we will follow-up with blood work and an ultrasound.  Hopefully she can have this done prior to her appointment. Other orders as above. Follow up in 1 year. The patient voiced understanding and agreement to the plan.  Mabel [place], DO 10/11/24 3:36 PM

## 2024-10-11 NOTE — Patient Instructions (Addendum)
 Give [place] 2-3 business days to get the results of your labs back.  ? ?Keep the diet clean and stay active. ? ?Please get me a copy of your advanced directive form at your convenience.  ? ?If you do not hear anything about your referral in the next 1-2 weeks, call our office and ask for an update. ? ?Let us know if you need anything. ?

## 2024-10-12 ENCOUNTER — Ambulatory Visit: Payer: Self-pay | Admitting: Family Medicine

## 2024-10-12 DIAGNOSIS — E042 Nontoxic multinodular goiter: Secondary | ICD-10-CM

## 2024-10-12 LAB — LIPID PANEL
Cholesterol: 178 mg/dL (ref 0–200)
HDL: 55.5 mg/dL (ref >39.00)
LDL Cholesterol: 104 mg/dL — ABNORMAL HIGH (ref 0–99)
NonHDL: 122.62
Total CHOL/HDL Ratio: 3
Triglycerides: 94 mg/dL (ref 0.0–149.0)
VLDL: 18.8 mg/dL (ref 0.0–40.0)

## 2024-10-12 LAB — COMPREHENSIVE METABOLIC PANEL WITH GFR
ALT: 12 U/L (ref 0–35)
AST: 16 U/L (ref 0–37)
Albumin: 4.7 g/dL (ref 3.5–5.2)
Alkaline Phosphatase: 70 U/L (ref 39–117)
BUN: 13 mg/dL (ref 6–23)
CO2: 28 meq/L (ref 19–32)
Calcium: 9.2 mg/dL (ref 8.4–10.5)
Chloride: 103 meq/L (ref 96–112)
Creatinine, Ser: 0.76 mg/dL (ref 0.40–1.20)
GFR: 94.16 mL/min (ref >60.00)
Glucose, Bld: 80 mg/dL (ref 70–99)
Potassium: 4 meq/L (ref 3.5–5.1)
Sodium: 140 meq/L (ref 135–145)
Total Bilirubin: 0.5 mg/dL (ref 0.2–1.2)
Total Protein: 7.3 g/dL (ref 6.0–8.3)

## 2024-10-12 LAB — CBC
HCT: 40.9 % (ref 36.0–46.0)
Hemoglobin: 13.8 g/dL (ref 12.0–15.0)
MCHC: 33.8 g/dL (ref 30.0–36.0)
MCV: 87.5 fl (ref 78.0–100.0)
Platelets: 209 K/uL (ref 150.0–400.0)
RBC: 4.68 Mil/uL (ref 3.87–5.11)
RDW: 13.6 % (ref 11.5–15.5)
WBC: 5.9 K/uL (ref 4.0–10.5)

## 2024-10-12 LAB — TSH: TSH: 0.42 u[IU]/mL (ref 0.35–5.50)

## 2024-10-12 LAB — T4, FREE: Free T4: 0.76 ng/dL (ref 0.60–1.60)

## 2024-10-18 ENCOUNTER — Ambulatory Visit (HOSPITAL_BASED_OUTPATIENT_CLINIC_OR_DEPARTMENT_OTHER)
Admission: RE | Admit: 2024-10-18 | Discharge: 2024-10-18 | Disposition: A | Discharge status: Home / Self Care | Source: Ambulatory Visit | Attending: Family Medicine | Admitting: Family Medicine

## 2024-10-18 DIAGNOSIS — E041 Nontoxic single thyroid nodule: Secondary | ICD-10-CM | POA: Insufficient documentation

## 2024-10-18 DIAGNOSIS — E042 Nontoxic multinodular goiter: Secondary | ICD-10-CM | POA: Diagnosis not present

## 2024-10-27 ENCOUNTER — Telehealth: Payer: Self-pay | Admitting: Family Medicine

## 2024-10-27 NOTE — Telephone Encounter (Signed)
Sent pt message with referral contact information.

## 2024-10-27 NOTE — Telephone Encounter (Signed)
 Copied from CRM #[id number]. Topic: Referral - Status >> Oct 26, 2024  4:44 PM Alfonso HERO wrote: Reason for CRM: patient calling to check on the status for her referral to see an endocrinologist. She states no one has called her to set up an appointment and wanted to know what's needed next. Please give her a call to update.

## 2024-11-07 DIAGNOSIS — Z01419 Encounter for gynecological examination (general) (routine) without abnormal findings: Secondary | ICD-10-CM | POA: Diagnosis not present

## 2024-11-07 DIAGNOSIS — C50912 Malignant neoplasm of unspecified site of left female breast: Secondary | ICD-10-CM | POA: Diagnosis not present

## 2024-11-07 DIAGNOSIS — Z170 Estrogen receptor positive status [ER+]: Secondary | ICD-10-CM | POA: Diagnosis not present

## 2024-11-09 DIAGNOSIS — C50912 Malignant neoplasm of unspecified site of left female breast: Secondary | ICD-10-CM | POA: Diagnosis not present

## 2024-11-09 DIAGNOSIS — Z170 Estrogen receptor positive status [ER+]: Secondary | ICD-10-CM | POA: Diagnosis not present

## 2024-11-16 ENCOUNTER — Encounter: Payer: Self-pay | Admitting: Endocrinology

## 2024-11-16 ENCOUNTER — Ambulatory Visit: Payer: Self-pay | Admitting: Endocrinology

## 2024-11-16 VITALS — BP 112/60 | HR 101 | Resp 16 | Ht 64.0 in | Wt 144.2 lb

## 2024-11-16 DIAGNOSIS — E042 Nontoxic multinodular goiter: Secondary | ICD-10-CM

## 2024-11-16 NOTE — Progress Notes (Signed)
 Outpatient Endocrinology Note Yaqub Arney, MD   Patient's Name: Angela Petersen    DOB: 1976/12/02    MRN: [id number]  REASON OF VISIT: New consult for thyroid nodules   PCP: Frann Mabel Mt, DO  REFERRING PROVIDER: Frann Mabel Mt, DO   HISTORY OF PRESENT ILLNESS:   Angela Petersen is a 48 y.o. old female with past medical history as listed below is presented for new consult for thyroid nodules.  Pertinent Thyroid History:  Patient is referred to endocrinology for evaluation and management of thyroid nodules, initial consult on November 16, 2024.  Patient is known to have multiple thyroid nodules for 10+ years, she was [place] for [place] and was following with endocrinologist in [place] and [place].  [place]  this area and establishing medical care.  Medical records from the chart and also is scanned into imaging records and [facility] reviewed.  Outside country record of ultrasound thyroid in October 20, 2016 along with records of ultrasound report and FNA reviewed.  She had fine-needle aspiration and aspiration of the cyst of right superior thyroid nodule mixed solid and cystic nodule measuring 2.4 x 2.5 x 1.6 cm, aspirated 20 mL in February 01, 2016, cytology was benign follicular nodule.  [facility].  She has relatively acute enlargement of right thyroid nodule at that time.  Ultrasound thyroid in November 2021 : Right superior thyroid nodule almost completely solid measuring 1.5 cm which was previously measured 2.5 cm and was mostly cystic in 2017.  Right inferior thyroid nodule heterogeneous predominantly solid measuring 1.5 cm which was measuring 1.3 centimeters in 2017 and 1.4 cm in 2020 in outside report.  Ultrasound thyroid in October 18, 2024 : Right superior thyroid nodule measuring 1.6 cm solid relatively stable compared to ultrasound in 2021 and right inferior thyroid nodule complex nodule increased cystic component measuring 2.5 cm.   Solid component of right inferior thyroid nodule is relatively stable compared to ultrasound in 2021 however it is measured increase in size due to cystic component.  Small left thyroid nodule/ subcentimeter.  Ultrasound images of 2017, November 2021 and October 2025 reviewed and compared.  Patient had predominantly right superior cystic nodule in 2017 which was aspirated and biopsied in the outside country, now has solid component of this nodule and has remained relatively stable in ultrasound in 2025 compared to ultrasound in 2021.  Right inferior thyroid nodule solid component is relatively stable and it has increased cystic component turning into more of a complex thyroid nodule.  CLINICAL DATA:  Prior ultrasound follow-up.   EXAM: October 2012 2025 THYROID ULTRASOUND   TECHNIQUE: Ultrasound examination of the thyroid gland and adjacent soft tissues was performed.   COMPARISON:  10/30/2020   FINDINGS: Parenchymal Echotexture: Mildly heterogeneous   Isthmus: 0.3 cm ,previously 0.5 cm   Right lobe: 6.1 x 2.0 x 2.0 cm ,previously 5.4 x 2.1 x 2.1 cm   Left lobe: 4.6 x 1.1 x 1.5 cm ,previously 4.5 x 1.2 x 1.3 cm   ________________________________________________________   Estimated total number of nodules >/= 1 cm: 2   Number of spongiform nodules >/=  2 cm not described below (TR1): 0   Number of mixed cystic and solid nodules >/= 1.5 cm not described below (TR2): 0   _________________________________________________________   Nodule # 1:   Prior biopsy: No   Location: Right; Mid   Maximum size: 2.3 cm; Other 2 dimensions: 1.4 x 1.5 cm, previously, 1.5 x 1.0  x 1.0 cm   Composition: mixed cystic and solid (1)   Echogenicity: isoechoic (1)   Shape: not taller-than-wide (0)   Margins: smooth (0)   Echogenic foci: none (0)   ACR TI-RADS total points: 2.   ACR TI-RADS risk category:  TR2 (2 points).   Significant change in size (>/= 20% in two dimensions and  minimal increase of 2 mm): Yes   Change in features: Yes   Change in ACR TI-RADS risk category: Yes   ACR TI-RADS recommendations:   This nodule does NOT meet TI-RADS criteria for biopsy or dedicated follow-up.   _________________________________________________________   Nodule # 2:   Prior biopsy: No   Location: Right; Superior   Maximum size: 1.6 cm; Other 2 dimensions: 0.9 x 1.3 cm, previously, 1.6 x 0.8 x 1.3 cm   Composition: solid/almost completely solid (2)   Echogenicity: hypoechoic (2)   Shape: not taller-than-wide (0)   Margins: smooth (0)   Echogenic foci: none (0)   ACR TI-RADS total points: 4.   ACR TI-RADS risk category:  TR4 (4-6 points).   Significant change in size (>/= 20% in two dimensions and minimal increase of 2 mm): No   Change in features: Yes   Change in ACR TI-RADS risk category: Yes   ACR TI-RADS recommendations:   **Given size (>/= 1.5 cm) and appearance, fine needle aspiration of this moderately suspicious nodule should be considered based on TI-RADS criteria.   _________________________________________________________   Similar benign appearing nodule in the right superior thyroid (labeled 4, 0.7 cm, previously 0.6 cm).   There are 2 benign-appearing left thyroid nodules measuring no greater than 0.6 cm.   No cervical lymphadenopathy.   IMPRESSION: 1. Multinodular thyroid. 2. Previously visualized right superior thyroid nodule (labeled 2, 1.6 cm, previous 1.6 cm) demonstrates hypoechoic sonographic characteristics and is therefore characterized as TI-RADS category 4, thus meeting criteria for tissue sampling. Recommend ultrasound-guided fine-needle aspiration. 3. Interval enlargement of the a due to development of cystic spaces of previously visualized right mid thyroid nodule (labeled 1, 2.3 cm, previously 1.5 cm) which is downgraded to TI-RADS category 2, not meeting criteria for dedicated follow-up. 4. The  additional scattered small thyroid nodules again appear benign and do not meet criteria for dedicated follow-up.      - Patient has family history of thyroid cancer in a younger sister and one of the cousins. - No radiation exposure to head and neck. - No neck discomfort or neck compressive symptoms. - She is euthyroid on no thyroid medication.  She has normal thyroid function test in October 2025 as follows.  - Labs:   Latest Reference Range & Units 10/11/24 15:23  TSH 0.35 - 5.50 uIU/mL 0.42  T4,Free(Direct) 0.60 - 1.60 ng/dL 9.23   REVIEW OF SYSTEMS:  As per history of present illness.   PAST MEDICAL HISTORY: Past Medical History:  Diagnosis Date   No known health problems     PAST SURGICAL HISTORY: Past Surgical History:  Procedure Laterality Date   BREAST FIBROADENOMA SURGERY  1994, 2004, 2017, 2018   bilateral   CESAREAN SECTION     OTHER SURGICAL HISTORY  2015   Removed tubes    ALLERGIES: No Known Allergies  FAMILY HISTORY:  Family History  Problem Relation Age of Onset   Cancer Mother 4       breast cancer   Cancer Sister 26       thyroid cancer   Cancer Brother 46  colon cancer   Cancer Maternal Aunt 57       breast cancer   Cancer Maternal Grandmother        Breast cancer   Cancer Paternal Grandmother        Breast cancer   Cancer Maternal Aunt 93       breast cancer    SOCIAL HISTORY: Social History   Socioeconomic History   Marital status: Married    Spouse name: Not on file   Number of children: Not on file   Years of education: Not on file   Highest education level: Not on file  Occupational History   Not on file  Tobacco Use   Smoking status: Never   Smokeless tobacco: Never  Substance and Sexual Activity   Alcohol use: Never   Drug use: Never   Sexual activity: Not on file  Other Topics Concern   Not on file  Social History Narrative   Not on file   Social Drivers of Health   Financial Resource Strain: Not on file   Food Insecurity: Low Risk  (11/06/2024)   Received from [facility]   Hunger Vital Sign    Within the past 12 months, you worried that your food would run out before you got money to buy more: Never true    Within the past 12 months, the food you bought just didn't last and you didn't have money to get more. : Never true  Transportation Needs: No Transportation Needs (11/06/2024)   Received from [facility]    In the past 12 months, has lack of reliable transportation kept you from medical appointments, meetings, work or from getting things needed for daily living? : No  Physical Activity: Not on file  Stress: Not on file  Social Connections: Not on file    MEDICATIONS:  Current Outpatient Medications  Medication Sig Dispense Refill   anastrozole (ARIMIDEX) 1 MG tablet Take 1 tablet by mouth daily.     fluconazole  (DIFLUCAN ) 150 MG tablet Take 1 tab, repeat in 72 hours if no improvement. (Patient taking differently: Take 1 tab, repeat in 72 hours if no improvement. As needed) 2 tablet 0   valACYclovir  (VALTREX ) 1000 MG tablet Take 2 tabs by mouth and repeat dose 12 hours later during outbreaks. 30 tablet 0   No current facility-administered medications for this visit.    PHYSICAL EXAM: Vitals:   11/16/24 1344  BP: 112/60  Pulse: (!) 101  Resp: 16  SpO2: 97%  Weight: 144 lb 3.2 oz (65.4 kg)  Height: 5' 4 (1.626 m)   Body mass index is 24.75 kg/m.  Wt Readings from Last 3 Encounters:  11/16/24 144 lb 3.2 oz (65.4 kg)  10/11/24 144 lb (65.3 kg)  02/15/24 149 lb (67.6 kg)     General: Well developed, well nourished female in no apparent distress.  HEENT: AT/[employer], no external lesions. Hearing intact to the spoken word Eyes: EOMI. No stare, proptosis. Conjunctiva clear and no icterus. Neck: Trachea midline, neck supple without appreciable thyromegaly or lymphadenopathy and right palpable thyroid nodule + Abdomen: Soft, non tender, non  distended Neurologic: Alert, oriented, normal speech, deep tendon biceps reflexes normal,  no gross focal neurological deficit Extremities: No pedal pitting edema, no tremors of outstretched hands Skin: Warm, color good.  Psychiatric: Does not appear depressed or anxious  PERTINENT HISTORIC LABORATORY AND IMAGING STUDIES:  All pertinent laboratory results were reviewed. Please see HPI also for further details.  TSH  Date Value Ref Range Status  10/11/2024 0.42 0.35 - 5.50 uIU/mL Final  10/24/2020 1.37 mIU/L Final    Comment:              Reference Range .           > or = 20 Years  0.40-4.50 .                Pregnancy Ranges           First trimester    0.26-2.66           Second trimester   0.55-2.73           Third trimester    0.43-2.91      ASSESSMENT / PLAN  1. Multiple thyroid  nodules    Patient is known to have multiple thyroid  nodules for 10+ years, she was following in outside countries with serial ultrasound.  With the records reviewed patient had FNA and aspiration of right superior thyroid  nodule, at that time was predominantly cystic in February 2017 with benign cytology.  Patient had ultrasound thyroid  in November 2021 and October 2025, also images available in the imaging records of ultrasound done in outside country in 2017.  Ultrasound images of 2017, November 2021 and October 2025 reviewed and compared.  Patient had predominantly right superior cystic nodule in 2017 which was aspirated and biopsied in the outside country, now has solid component of this nodule and has remained relatively stable in ultrasound in 2025 compared to ultrasound in 2021.  Right inferior thyroid  nodule solid component is relatively stable and it has increased cystic component turning into more of a complex thyroid  nodule.  She has family history of thyroid  cancer in sister and one of the cousins.  No worrisome ultrasound features of thyroid  nodules.  Plan: - These nodules can be  monitored with serial ultrasound.  Right superior thyroid  nodule was biopsied in 2017 with benign cytology.  Left inferior thyroid  nodule is more of complex with increased cystic component and measured increased size due to cystic component and has relatively stable solid component over the period of last 4 years.  This nodule can also be monitored with serial ultrasound.  No plan for needle biopsy.  She has a small left thyroid  nodule subcentimeter can also be monitored, no indication of biopsy. - She had normal thyroid  function test in October.  No labs today.  She is euthyroid clinically. - Will plan to recheck ultrasound thyroid  in 1 year prior to follow-up visit.   Diagnoses and all orders for this visit:  Multiple thyroid  nodules -     US  THYROID ; Future    DISPOSITION Follow up in clinic in 12 months suggested.  All questions answered and patient verbalized understanding of the plan.  Carlos Quackenbush, MD [facility] Endocrinology [facility] [address] [place], [address] [place], [place] [address] Phone # [phone]  At least part of this note was generated using voice recognition software. Inadvertent word errors may have occurred, which were not recognized during the proofreading process.

## 2024-12-07 ENCOUNTER — Institutional Professional Consult (permissible substitution) (INDEPENDENT_AMBULATORY_CARE_PROVIDER_SITE_OTHER)

## 2025-01-17 ENCOUNTER — Ambulatory Visit: Admitting: Endocrinology

## 2025-10-15 ENCOUNTER — Encounter: Admitting: Family Medicine

## 2025-11-15 ENCOUNTER — Ambulatory Visit: Admitting: Endocrinology
